# Patient Record
Sex: Male | Born: 1990 | Race: Black or African American | Hispanic: No | Marital: Single | Smoking: Current every day smoker
Health system: Southern US, Community
[De-identification: ages and names within clinical notes are randomized; demographics above are authoritative.]

## PROBLEM LIST (undated history)

## (undated) DIAGNOSIS — S62009A Unspecified fracture of navicular [scaphoid] bone of unspecified wrist, initial encounter for closed fracture: Secondary | ICD-10-CM

## (undated) DIAGNOSIS — R569 Unspecified convulsions: Secondary | ICD-10-CM

## (undated) DIAGNOSIS — Z87828 Personal history of other (healed) physical injury and trauma: Secondary | ICD-10-CM

## (undated) HISTORY — PX: NO PAST SURGERIES: SHX2092

---
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
## 2009-04-10 ENCOUNTER — Emergency Department (HOSPITAL_COMMUNITY): Admission: EM | Admit: 2009-04-10 | Discharge: 2009-04-10 | Payer: Self-pay | Admitting: Emergency Medicine

## 2010-05-25 ENCOUNTER — Emergency Department (HOSPITAL_COMMUNITY)
Admission: EM | Admit: 2010-05-25 | Discharge: 2010-05-25 | Disposition: A | Discharge status: Home / Self Care | Payer: Self-pay | Attending: Emergency Medicine | Admitting: Emergency Medicine

## 2010-05-25 DIAGNOSIS — R404 Transient alteration of awareness: Secondary | ICD-10-CM | POA: Insufficient documentation

## 2010-05-25 DIAGNOSIS — F1210 Cannabis abuse, uncomplicated: Secondary | ICD-10-CM | POA: Insufficient documentation

## 2010-05-25 DIAGNOSIS — G40909 Epilepsy, unspecified, not intractable, without status epilepticus: Secondary | ICD-10-CM | POA: Insufficient documentation

## 2010-05-25 LAB — URINALYSIS, ROUTINE W REFLEX MICROSCOPIC
Bilirubin Urine: NEGATIVE
Glucose, UA: NEGATIVE mg/dL
Hgb urine dipstick: NEGATIVE
Ketones, ur: NEGATIVE mg/dL
Leukocytes, UA: NEGATIVE
Nitrite: NEGATIVE
Protein, ur: 30 mg/dL — AB
Specific Gravity, Urine: 1.024 (ref 1.005–1.030)
Urobilinogen, UA: 0.2 mg/dL (ref 0.0–1.0)
pH: 6 (ref 5.0–8.0)

## 2010-05-25 LAB — BASIC METABOLIC PANEL
Calcium: 9.2 mg/dL (ref 8.4–10.5)
GFR calc Af Amer: >60 mL/min (ref >60)
GFR calc non Af Amer: >60 mL/min (ref >60)
Glucose, Bld: 118 mg/dL — ABNORMAL HIGH (ref 70–99)
Potassium: 3.8 mEq/L (ref 3.5–5.1)
Sodium: 137 mEq/L (ref 135–145)

## 2010-05-25 LAB — DIFFERENTIAL
Basophils Absolute: 0 10*3/uL (ref 0.0–0.1)
Basophils Relative: 1 % (ref 0–1)
Eosinophils Absolute: 0.2 10*3/uL (ref 0.0–0.7)
Eosinophils Relative: 2 % (ref 0–5)
Lymphocytes Relative: 39 % (ref 12–46)
Lymphs Abs: 2.9 K/uL (ref 0.7–4.0)
Monocytes Absolute: 0.7 10*3/uL (ref 0.1–1.0)
Monocytes Relative: 9 % (ref 3–12)
Neutro Abs: 3.6 K/uL (ref 1.7–7.7)
Neutrophils Relative %: 49 % (ref 43–77)

## 2010-05-25 LAB — RAPID URINE DRUG SCREEN, HOSP PERFORMED
Amphetamines: NOT DETECTED
Barbiturates: NOT DETECTED
Benzodiazepines: POSITIVE — AB
Cocaine: NOT DETECTED
Opiates: NOT DETECTED
Tetrahydrocannabinol: POSITIVE — AB

## 2010-05-25 LAB — BASIC METABOLIC PANEL WITH GFR
BUN: 14 mg/dL (ref 6–23)
CO2: 28 meq/L (ref 19–32)
Chloride: 102 meq/L (ref 96–112)
Creatinine, Ser: 1.25 mg/dL (ref 0.4–1.5)

## 2010-05-25 LAB — CBC
HCT: 41.1 % (ref 39.0–52.0)
Hemoglobin: 14 g/dL (ref 13.0–17.0)
MCH: 30.2 pg (ref 26.0–34.0)
MCHC: 34.1 g/dL (ref 30.0–36.0)
MCV: 88.6 fL (ref 78.0–100.0)
Platelets: 183 10*3/uL (ref 150–400)
RBC: 4.64 MIL/uL (ref 4.22–5.81)
RDW: 12.6 % (ref 11.5–15.5)
WBC: 7.4 10*3/uL (ref 4.0–10.5)

## 2010-05-25 LAB — URINE MICROSCOPIC-ADD ON: Urine-Other: NONE SEEN

## 2010-06-27 NOTE — Procedures (Signed)
EEG NUMBER:  [id number].   HISTORY:  A 20 year old male with a history of possible seizure activity  over the last 1-1/2 years.  He becomes dizzy and says strange things  prior to the episodes.  He had a febrile seizure at age 10.  His next  seizure-like event was at age 66.  Study is being done to look for the  presence of seizures (780.39).   PROCEDURE:  The tracing is carried out on a 32-digital Cadwell recorder  reformatted into 16-channel montages with 1 devoted to EKG.  The patient  was awake and asleep during the recording.  The International 10/20  system lead placement was used.   MEDICATIONS:  He takes no medication.   SIGNIFICANT FINDINGS:  Dominant frequency is 9-10 Hz 10 microvolt alpha  range activity.  At times, it is of higher voltage and attenuates with  eye opening.   The patient becomes drowsy with mixed frequency theta and delta range  activity.  The patient then drifts in natural sleep with vertex sharp  waves, symmetric sleep spindles and desynchronized delta range  background.   Activating procedures with photic stimulation induced a driving response  of 0454 Hz.  Hyperventilation caused little change.   EKG showed regular sinus rhythm with ventricular response of 60 beats  per minute.   IMPRESSION:  Normal record with the patient awake and asleep.      Deanna Artis. Sharene Skeans, M.D.  Electronically Signed     [id number]  D:  03/06/2007 20:55:11  T:  03/07/2007 09:47:57  Job #:  [id number]   cc:   Lear Ng, MD  Fax: [phone]

## 2010-11-06 LAB — I-STAT 8, (EC8 V) (CONVERTED LAB)
Acid-base deficit: 3 — ABNORMAL HIGH
Bicarbonate: 22.7
Chloride: 105
HCT: 45
Hemoglobin: 15.3
Operator id: 294341
Sodium: 138
pCO2, Ven: 42.2 — ABNORMAL LOW

## 2010-11-06 LAB — PHENYTOIN LEVEL, TOTAL: Phenytoin Lvl: <2.5 — ABNORMAL LOW

## 2010-11-06 LAB — RAPID URINE DRUG SCREEN, HOSP PERFORMED
Amphetamines: NOT DETECTED
Barbiturates: NOT DETECTED

## 2010-11-06 LAB — POCT I-STAT CREATININE: Operator id: 294341

## 2010-11-27 LAB — RAPID URINE DRUG SCREEN, HOSP PERFORMED
Barbiturates: NOT DETECTED
Opiates: NOT DETECTED

## 2010-11-27 LAB — COMPREHENSIVE METABOLIC PANEL
Alkaline Phosphatase: 109
BUN: 8
Chloride: 104
Glucose, Bld: 96
Potassium: 3.8
Total Bilirubin: 0.4

## 2010-11-27 LAB — URINALYSIS, ROUTINE W REFLEX MICROSCOPIC
Bilirubin Urine: NEGATIVE
Glucose, UA: NEGATIVE
Hgb urine dipstick: NEGATIVE
Nitrite: NEGATIVE
Specific Gravity, Urine: 1.02
pH: 6.5

## 2011-12-25 ENCOUNTER — Encounter (HOSPITAL_COMMUNITY): Payer: Self-pay | Admitting: Emergency Medicine

## 2011-12-25 ENCOUNTER — Emergency Department (HOSPITAL_COMMUNITY)
Admission: EM | Admit: 2011-12-25 | Discharge: 2011-12-25 | Discharge status: Against Medical Advice | Payer: Self-pay | Attending: Emergency Medicine | Admitting: Emergency Medicine

## 2011-12-25 DIAGNOSIS — F17200 Nicotine dependence, unspecified, uncomplicated: Secondary | ICD-10-CM | POA: Insufficient documentation

## 2011-12-25 DIAGNOSIS — G40909 Epilepsy, unspecified, not intractable, without status epilepticus: Secondary | ICD-10-CM | POA: Insufficient documentation

## 2011-12-25 DIAGNOSIS — Z5329 Procedure and treatment not carried out because of patient's decision for other reasons: Secondary | ICD-10-CM

## 2011-12-25 DIAGNOSIS — R569 Unspecified convulsions: Secondary | ICD-10-CM

## 2011-12-25 HISTORY — DX: Unspecified convulsions: R56.9

## 2011-12-25 NOTE — ED Notes (Signed)
[id number]<AV> Expected date:<BR> Expected time: 4:02 PM<BR> Means of arrival:Ambulance<BR> Comments:<BR> **RM 10** Seizure
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
## 2012-09-18 ENCOUNTER — Other Ambulatory Visit: Payer: Self-pay | Admitting: Family

## 2012-09-18 DIAGNOSIS — G40209 Localization-related (focal) (partial) symptomatic epilepsy and epileptic syndromes with complex partial seizures, not intractable, without status epilepticus: Secondary | ICD-10-CM | POA: Insufficient documentation

## 2012-09-18 MED ORDER — KEPPRA XR 750 MG PO TB24: ORAL_TABLET | ORAL | Status: DC

## 2012-09-26 ENCOUNTER — Telehealth: Payer: Self-pay | Admitting: Family

## 2012-09-26 NOTE — Telephone Encounter (Signed)
I called Reginald Dyer and told him that we had received his Keppra XR from patient assistance program and that it was at front desk to pick up. TG

## 2012-11-29 DIAGNOSIS — S62009A Unspecified fracture of navicular [scaphoid] bone of unspecified wrist, initial encounter for closed fracture: Secondary | ICD-10-CM

## 2012-11-29 DIAGNOSIS — Z87828 Personal history of other (healed) physical injury and trauma: Secondary | ICD-10-CM

## 2012-11-29 HISTORY — DX: Unspecified fracture of navicular (scaphoid) bone of unspecified wrist, initial encounter for closed fracture: S62.009A

## 2012-11-29 HISTORY — DX: Personal history of other (healed) physical injury and trauma: Z87.828

## 2012-12-11 ENCOUNTER — Encounter (HOSPITAL_BASED_OUTPATIENT_CLINIC_OR_DEPARTMENT_OTHER): Payer: Self-pay | Admitting: *Deleted

## 2012-12-11 ENCOUNTER — Other Ambulatory Visit: Payer: Self-pay | Admitting: Orthopedic Surgery
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
## 2012-12-11 NOTE — Pre-Procedure Instructions (Signed)
CT chest from 11/29/2012 reviewed with Dr. Sampson Goon; do not need to do pre-op CXR; pt. OK to come for surgery.

## 2012-12-11 NOTE — Pre-Procedure Instructions (Signed)
History discussed with Dr. Sampson Goon; need to find out if pt. had been off his Keppra when he had seizure 11/29/2012.  Needs to be taking med. regularly.  H & P, discharge summary, and CXR requested from [facility].
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
## 2012-12-17 ENCOUNTER — Ambulatory Visit (HOSPITAL_BASED_OUTPATIENT_CLINIC_OR_DEPARTMENT_OTHER): Payer: 59 | Admitting: *Deleted

## 2012-12-17 ENCOUNTER — Encounter (HOSPITAL_BASED_OUTPATIENT_CLINIC_OR_DEPARTMENT_OTHER)
Admission: RE | Disposition: A | Discharge status: Home / Self Care | Payer: Self-pay | Source: Ambulatory Visit | Attending: Orthopedic Surgery

## 2012-12-17 ENCOUNTER — Encounter (HOSPITAL_BASED_OUTPATIENT_CLINIC_OR_DEPARTMENT_OTHER): Payer: Self-pay | Admitting: *Deleted

## 2012-12-17 ENCOUNTER — Ambulatory Visit (HOSPITAL_BASED_OUTPATIENT_CLINIC_OR_DEPARTMENT_OTHER)
Admission: RE | Admit: 2012-12-17 | Discharge: 2012-12-17 | Disposition: A | Discharge status: Home / Self Care | Payer: 59 | Source: Ambulatory Visit | Attending: Orthopedic Surgery | Admitting: Orthopedic Surgery

## 2012-12-17 ENCOUNTER — Encounter (HOSPITAL_BASED_OUTPATIENT_CLINIC_OR_DEPARTMENT_OTHER): Payer: 59 | Admitting: *Deleted

## 2012-12-17 DIAGNOSIS — S62001A Unspecified fracture of navicular [scaphoid] bone of right wrist, initial encounter for closed fracture: Secondary | ICD-10-CM

## 2012-12-17 DIAGNOSIS — S62009A Unspecified fracture of navicular [scaphoid] bone of unspecified wrist, initial encounter for closed fracture: Secondary | ICD-10-CM | POA: Insufficient documentation

## 2012-12-17 HISTORY — PX: OPEN REDUCTION INTERNAL FIXATION (ORIF) SCAPHOID WITH DISTAL RADIUS GRAFT: SHX5667

## 2012-12-17 HISTORY — DX: Unspecified fracture of navicular (scaphoid) bone of unspecified wrist, initial encounter for closed fracture: S62.009A

## 2012-12-17 HISTORY — DX: Personal history of other (healed) physical injury and trauma: Z87.828

## 2012-12-17 LAB — POCT HEMOGLOBIN-HEMACUE: Hemoglobin: 12.9 g/dL — ABNORMAL LOW (ref 13.0–17.0)

## 2012-12-17 SURGERY — OPEN REDUCTION INTERNAL FIXATION (ORIF) SCAPHOID WITH DISTAL RADIUS GRAFT
Anesthesia: Regional | Site: Wrist | Laterality: Right | Wound class: Clean

## 2012-12-17 MED ORDER — LIDOCAINE HCL (CARDIAC) 20 MG/ML IV SOLN
INTRAVENOUS | Status: DC | PRN
  Administered 2012-12-17: 80 mg via INTRAVENOUS

## 2012-12-17 MED ORDER — MIDAZOLAM HCL 2 MG/2ML IJ SOLN
INTRAMUSCULAR | Status: AC
  Filled 2012-12-17: qty 2

## 2012-12-17 MED ORDER — LACTATED RINGERS IV SOLN
INTRAVENOUS | Status: DC
  Administered 2012-12-17 (×2): via INTRAVENOUS

## 2012-12-17 MED ORDER — FENTANYL CITRATE 0.05 MG/ML IJ SOLN
INTRAMUSCULAR | Status: AC
  Filled 2012-12-17: qty 6

## 2012-12-17 MED ORDER — FENTANYL CITRATE 0.05 MG/ML IJ SOLN
50.0000 ug | INTRAMUSCULAR | Status: DC | PRN
  Administered 2012-12-17: 100 ug via INTRAVENOUS

## 2012-12-17 MED ORDER — OXYCODONE HCL 5 MG/5ML PO SOLN: 5.0000 mg | Freq: Once | ORAL | Status: DC | PRN

## 2012-12-17 MED ORDER — OXYCODONE-ACETAMINOPHEN 5-325 MG PO TABS: 1.0000 | ORAL_TABLET | ORAL | Status: DC | PRN

## 2012-12-17 MED ORDER — ONDANSETRON HCL 4 MG/2ML IJ SOLN: 4.0000 mg | Freq: Four times a day (QID) | INTRAMUSCULAR | Status: DC | PRN

## 2012-12-17 MED ORDER — CEFAZOLIN SODIUM-DEXTROSE 2-3 GM-% IV SOLR
2.0000 g | INTRAVENOUS | Status: AC
  Administered 2012-12-17: 2 g via INTRAVENOUS

## 2012-12-17 MED ORDER — CEFAZOLIN SODIUM-DEXTROSE 2-3 GM-% IV SOLR
INTRAVENOUS | Status: AC
  Filled 2012-12-17: qty 50

## 2012-12-17 MED ORDER — HYDROMORPHONE HCL PF 1 MG/ML IJ SOLN: 0.2500 mg | INTRAMUSCULAR | Status: DC | PRN

## 2012-12-17 MED ORDER — FENTANYL CITRATE 0.05 MG/ML IJ SOLN
INTRAMUSCULAR | Status: AC
  Filled 2012-12-17: qty 2

## 2012-12-17 MED ORDER — DEXAMETHASONE SODIUM PHOSPHATE 10 MG/ML IJ SOLN
INTRAMUSCULAR | Status: DC | PRN
  Administered 2012-12-17: 10 mg via INTRAVENOUS

## 2012-12-17 MED ORDER — BUPIVACAINE HCL (PF) 0.25 % IJ SOLN
INTRAMUSCULAR | Status: AC
  Filled 2012-12-17: qty 30

## 2012-12-17 MED ORDER — BUPIVACAINE HCL (PF) 0.25 % IJ SOLN
INTRAMUSCULAR | Status: DC | PRN
  Administered 2012-12-17: 5 mL

## 2012-12-17 MED ORDER — PROPOFOL 10 MG/ML IV BOLUS
INTRAVENOUS | Status: DC | PRN
  Administered 2012-12-17: 260 mg via INTRAVENOUS

## 2012-12-17 MED ORDER — OXYCODONE HCL 5 MG PO TABS: 5.0000 mg | ORAL_TABLET | Freq: Once | ORAL | Status: DC | PRN

## 2012-12-17 MED ORDER — MIDAZOLAM HCL 2 MG/2ML IJ SOLN
1.0000 mg | INTRAMUSCULAR | Status: DC | PRN
  Administered 2012-12-17: 2 mg via INTRAVENOUS

## 2012-12-17 MED ORDER — ONDANSETRON HCL 4 MG/2ML IJ SOLN
INTRAMUSCULAR | Status: DC | PRN
  Administered 2012-12-17: 4 mg via INTRAVENOUS

## 2012-12-17 MED ORDER — CHLORHEXIDINE GLUCONATE 4 % EX LIQD: 60.0000 mL | Freq: Once | CUTANEOUS | Status: DC

## 2012-12-17 MED ORDER — BUPIVACAINE-EPINEPHRINE PF 0.5-1:200000 % IJ SOLN
INTRAMUSCULAR | Status: DC | PRN
  Administered 2012-12-17: 30 mL

## 2012-12-17 SURGICAL SUPPLY — 67 items
.045 K-WIRE ×3 IMPLANT
2.2 DRILL BIT ×1 IMPLANT
APL SKNCLS STERI-STRIP NONHPOA (GAUZE/BANDAGES/DRESSINGS)
BAG DECANTER FOR FLEXI CONT (MISCELLANEOUS) IMPLANT
BANDAGE ELASTIC 3 VELCRO ST LF (GAUZE/BANDAGES/DRESSINGS) IMPLANT
BANDAGE ELASTIC 4 VELCRO ST LF (GAUZE/BANDAGES/DRESSINGS) ×2 IMPLANT
BANDAGE GAUZE ELAST BULKY 4 IN (GAUZE/BANDAGES/DRESSINGS) ×2 IMPLANT
BENZOIN TINCTURE PRP APPL 2/3 (GAUZE/BANDAGES/DRESSINGS) IMPLANT
BLADE MINI RND TIP GREEN BEAV (BLADE) IMPLANT
BLADE SURG 15 STRL LF DISP TIS (BLADE) ×2 IMPLANT
BLADE SURG 15 STRL SS (BLADE) ×4
BNDG CMPR 9X4 STRL LF SNTH (GAUZE/BANDAGES/DRESSINGS) ×1
BNDG ESMARK 4X9 LF (GAUZE/BANDAGES/DRESSINGS) ×1 IMPLANT
CANISTER SUCT 1200ML W/VALVE (MISCELLANEOUS) IMPLANT
CORDS BIPOLAR (ELECTRODE) ×2 IMPLANT
COVER TABLE BACK 60X90 (DRAPES) ×2 IMPLANT
CUFF TOURNIQUET SINGLE 18IN (TOURNIQUET CUFF) ×1 IMPLANT
DECANTER SPIKE VIAL GLASS SM (MISCELLANEOUS) IMPLANT
DRAPE EXTREMITY T 121X128X90 (DRAPE) ×2 IMPLANT
DRAPE OEC MINIVIEW 54X84 (DRAPES) ×2 IMPLANT
DRAPE SURG 17X23 STRL (DRAPES) ×2 IMPLANT
DURAPREP 26ML APPLICATOR (WOUND CARE) ×2 IMPLANT
GAUZE SPONGE 4X4 16PLY XRAY LF (GAUZE/BANDAGES/DRESSINGS) IMPLANT
GAUZE XEROFORM 1X8 LF (GAUZE/BANDAGES/DRESSINGS) ×1 IMPLANT
GLOVE BIO SURGEON STRL SZ 6.5 (GLOVE) ×1 IMPLANT
GLOVE BIO SURGEON STRL SZ8 (GLOVE) ×2 IMPLANT
GLOVE BIOGEL PI IND STRL 7.0 (GLOVE) IMPLANT
GLOVE BIOGEL PI IND STRL 8 (GLOVE) IMPLANT
GLOVE BIOGEL PI IND STRL 8.5 (GLOVE) IMPLANT
GLOVE BIOGEL PI INDICATOR 7.0 (GLOVE) ×1
GLOVE BIOGEL PI INDICATOR 8 (GLOVE) ×1
GLOVE BIOGEL PI INDICATOR 8.5 (GLOVE) ×1
GLOVE ECLIPSE 6.5 STRL STRAW (GLOVE) ×1 IMPLANT
GLOVE SURG ORTHO 8.0 STRL STRW (GLOVE) ×1 IMPLANT
GOWN BRE IMP PREV XXLGXLNG (GOWN DISPOSABLE) ×3 IMPLANT
GOWN PREVENTION PLUS XLARGE (GOWN DISPOSABLE) ×3 IMPLANT
NDL HYPO 25X1 1.5 SAFETY (NEEDLE) ×1 IMPLANT
NEEDLE HYPO 25X1 1.5 SAFETY (NEEDLE) ×2 IMPLANT
NS IRRIG 1000ML POUR BTL (IV SOLUTION) ×2 IMPLANT
PACK BASIN DAY SURGERY FS (CUSTOM PROCEDURE TRAY) ×2 IMPLANT
PAD CAST 3X4 CTTN HI CHSV (CAST SUPPLIES) ×1 IMPLANT
PAD CAST 4YDX4 CTTN HI CHSV (CAST SUPPLIES) IMPLANT
PADDING CAST ABS 4INX4YD NS (CAST SUPPLIES) ×1
PADDING CAST ABS COTTON 4X4 ST (CAST SUPPLIES) ×1 IMPLANT
PADDING CAST COTTON 3X4 STRL (CAST SUPPLIES) ×2
PADDING CAST COTTON 4X4 STRL (CAST SUPPLIES) ×2
SCREW COMPRESSION 3.5X20 (Screw) ×1 IMPLANT
SHEET MEDIUM DRAPE 40X70 STRL (DRAPES) ×2 IMPLANT
SPLINT PLASTER CAST XFAST 3X15 (CAST SUPPLIES) IMPLANT
SPLINT PLASTER CAST XFAST 4X15 (CAST SUPPLIES) ×5 IMPLANT
SPLINT PLASTER XTRA FAST SET 4 (CAST SUPPLIES) ×15
SPLINT PLASTER XTRA FASTSET 3X (CAST SUPPLIES)
SPONGE GAUZE 4X4 12PLY (GAUZE/BANDAGES/DRESSINGS) ×2 IMPLANT
STOCKINETTE 4X48 STRL (DRAPES) ×2 IMPLANT
STRIP CLOSURE SKIN 1/2X4 (GAUZE/BANDAGES/DRESSINGS) IMPLANT
SUCTION FRAZIER TIP 10 FR DISP (SUCTIONS) IMPLANT
SUT ETHILON 4 0 PS 2 18 (SUTURE) IMPLANT
SUT MERSILENE 4 0 P 3 (SUTURE) IMPLANT
SUT PROLENE 3 0 PS 2 (SUTURE) IMPLANT
SUT SILK 2 0 FS (SUTURE) IMPLANT
SUT VIC AB 3-0 FS2 27 (SUTURE) IMPLANT
SUT VICRYL RAPIDE 4/0 PS 2 (SUTURE) IMPLANT
SYR BULB 3OZ (MISCELLANEOUS) ×2 IMPLANT
SYRINGE 10CC LL (SYRINGE) ×2 IMPLANT
TOWEL OR 17X24 6PK STRL BLUE (TOWEL DISPOSABLE) ×2 IMPLANT
TUBE CONNECTING 20X1/4 (TUBING) IMPLANT
UNDERPAD 30X30 INCONTINENT (UNDERPADS AND DIAPERS) ×2 IMPLANT

## 2012-12-17 NOTE — Op Note (Signed)
See [id number]

## 2012-12-17 NOTE — Interval H&P Note (Signed)
History and Physical Interval Note:  12/17/2012 8:38 AM  Reginald Dyer  has presented today for surgery, with the diagnosis of right scaphoid fracture   The various methods of treatment have been discussed with the patient and family. After consideration of risks, benefits and other options for treatment, the patient has consented to  Procedure(s): OPEN REDUCTION INTERNAL FIXATION (ORIF) RIGHT SCAPHOID WITH POSSIBLE DISTAL RADIUS GRAFT (Right) as a surgical intervention .  The patient's history has been reviewed, patient examined, no change in status, stable for surgery.  I have reviewed the patient's chart and labs.  Questions were answered to the patient's satisfaction.     [name]

## 2012-12-17 NOTE — Anesthesia Procedure Notes (Addendum)
Procedure Name: LMA Insertion Date/Time: 12/17/2012 2:40 PM Performed by: [name] Pre-anesthesia Checklist: Patient identified, Emergency Drugs available, Suction available and Patient being monitored Patient Re-evaluated:Patient Re-evaluated prior to inductionOxygen Delivery Method: Circle System Utilized Preoxygenation: Pre-oxygenation with 100% oxygen Intubation Type: IV induction Ventilation: Mask ventilation without difficulty LMA: LMA inserted LMA Size: 5.0 Number of attempts: 1 Airway Equipment and Method: bite block Placement Confirmation: positive ETCO2 and breath sounds checked- equal and bilateral Tube secured with: Tape Dental Injury: Teeth and Oropharynx as per pre-operative assessment    Anesthesia Regional Block:  Infraclavicular brachial plexus block  Pre-Anesthetic Checklist: ,, timeout performed, Correct Patient, Correct Site, Correct Laterality, Correct Procedure, Correct Position, site marked, Risks and benefits discussed,  Surgical consent,  Pre-op evaluation,  At surgeon's request and post-op pain management  Laterality: Right  Prep: chloraprep       Needles:  Injection technique: Single-shot  Needle Type: Echogenic Stimulator Needle     Needle Length:cm 9 cm Needle Gauge: 21 and 21 G    Additional Needles:  Procedures: ultrasound guided (picture in chart) and nerve stimulator Infraclavicular brachial plexus block  Nerve Stimulator or Paresthesia:  Response: biceps flexion, 0.45 mA,   Additional Responses:   Narrative:  Start time: 12/17/2012 2:01 PM End time: 12/17/2012 2:14 PM Injection made incrementally with aspirations every 5 mL.  Performed by: Personally  Anesthesiologist: Dr Chaney Malling  Additional Notes: Functioning IV was confirmed and monitors were applied.  A 90mm 21ga Arrow echogenic stimulator needle was used. Sterile prep and drape,hand hygiene and sterile gloves were used.  Negative aspiration and negative test dose  prior to incremental administration of local anesthetic. The patient tolerated the procedure well.  Ultrasound guidance: relevent anatomy identified, needle position confirmed, local anesthetic spread visualized around nerve(s), vascular puncture avoided.  Image printed for medical record.   Infraclavicular brachial plexus block

## 2012-12-17 NOTE — Anesthesia Postprocedure Evaluation (Signed)
Anesthesia Post Note  Patient: Reginald Dyer  Procedure(s) Performed: Procedure(s) (LRB): OPEN REDUCTION INTERNAL FIXATION (ORIF) RIGHT SCAPHOID WITH POSSIBLE DISTAL RADIUS GRAFT (Right)  Anesthesia type: General  Patient location: PACU  Post pain: Pain level controlled and Adequate analgesia  Post assessment: Post-op Vital signs reviewed, Patient's Cardiovascular Status Stable, Respiratory Function Stable, Patent Airway and Pain level controlled  Last Vitals:  Filed Vitals:   12/17/12 1615  BP: 119/79  Pulse: 49  Temp:   Resp: 14    Post vital signs: Reviewed and stable  Level of consciousness: awake, alert  and oriented  Complications: No apparent anesthesia complications
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
## 2012-12-17 NOTE — Transfer of Care (Signed)
Immediate Anesthesia Transfer of Care Note  Patient: Reginald Dyer  Procedure(s) Performed: Procedure(s): OPEN REDUCTION INTERNAL FIXATION (ORIF) RIGHT SCAPHOID WITH POSSIBLE DISTAL RADIUS GRAFT (Right)  Patient Location: PACU  Anesthesia Type:GA combined with regional for post-op pain  Level of Consciousness: awake, alert  and oriented  Airway & Oxygen Therapy: Patient Spontanous Breathing and Patient connected to face mask oxygen  Post-op Assessment: Report given to PACU RN, Post -op Vital signs reviewed and stable and Patient moving all extremities  Post vital signs: Reviewed and stable  Complications: No apparent anesthesia complications

## 2012-12-17 NOTE — Progress Notes (Signed)
Assisted Dr. Hodierne with right, ultrasound guided, infraclavicular block. Side rails up, monitors on throughout procedure. See vital signs in flow sheet. Tolerated Procedure well. 

## 2012-12-17 NOTE — Anesthesia Preprocedure Evaluation (Signed)
Anesthesia Evaluation  Patient identified by MRN, date of birth, ID band Patient awake    Reviewed: Allergy & Precautions, H&P , NPO status , Patient's Chart, lab work & pertinent test results  Airway Mallampati: II  Neck ROM: full    Dental   Pulmonary former smoker,          Cardiovascular negative cardio ROS      Neuro/Psych Seizures -,     GI/Hepatic   Endo/Other    Renal/GU      Musculoskeletal   Abdominal   Peds  Hematology   Anesthesia Other Findings   Reproductive/Obstetrics                           Anesthesia Physical Anesthesia Plan  ASA: II  Anesthesia Plan: General and Regional   Post-op Pain Management: MAC Combined w/ Regional for Post-op pain   Induction: Intravenous  Airway Management Planned: LMA  Additional Equipment:   Intra-op Plan:   Post-operative Plan:   Informed Consent: I have reviewed the patients History and Physical, chart, labs and discussed the procedure including the risks, benefits and alternatives for the proposed anesthesia with the patient or authorized representative who has indicated his/her understanding and acceptance.     Plan Discussed with: CRNA, Anesthesiologist and Surgeon  Anesthesia Plan Comments:         Anesthesia Quick Evaluation

## 2012-12-19 ENCOUNTER — Encounter (HOSPITAL_BASED_OUTPATIENT_CLINIC_OR_DEPARTMENT_OTHER): Payer: Self-pay | Admitting: Orthopedic Surgery

## 2012-12-19 NOTE — Op Note (Signed)
NAME:  Reginald Dyer, Reginald Dyer                     ACCOUNT NO.:  MEDICAL RECORD NO.:  [id number]  LOCATION:                               FACILITY:  [facility]  PHYSICIAN:  Artist Pais. Adrieanna Boteler, M.D.DATE OF BIRTH:  Feb 10, 1991  DATE OF PROCEDURE:  12/17/2012 DATE OF DISCHARGE:  12/17/2012                              OPERATIVE REPORT   PREOPERATIVE DIAGNOSIS:  Right scaphoid fracture.  POSTOPERATIVE DIAGNOSIS:  Right scaphoid fracture.  PROCEDURE:  Open reduction and internal fixation of above.  SURGEON:  Artist Pais. Mina Marble, MD  ASSISTANT:  Cindee Salt, MD  ANESTHESIA:  General.  COMPLICATIONS:  No complication.  DRAINS:  No drains.  DESCRIPTION OF PROCEDURE:  The patient was taken to the operating suite. After induction of general anesthesia, right upper extremity prepped and draped in sterile fashion.  An Esmarch was used to exsanguinate the limb.  Tourniquet was then inflated to 250 mmHg.  At this point in time, a mini Arthrex guidewire for compression screw set was driven through a small incision at the distal pole of the scaphoid trapezial area and the guide wire was placed onto the bone under fluoroscopic guidance.  It was driven across the fracture site.  AP, lateral, and oblique views were used to determine good pin position.  The 2nd pin was used to prevent rotational issues. Once this was done, we used the appropriate Arthrex compression drills to drill the distal pole and then across the fracture site into the proximal pole.  A 20 mm screw was placed under direct and fluoroscopic guidance.  Intraoperative fluoroscopy revealed adequate reduction of the AP, lateral, and oblique view.  The guidewires were removed.  The wound was irrigated and closed with 4-0 Vicryl Rapide. Xeroform, 4x4s, and radial gutter splint was applied.  The patient tolerated the procedure well, went to recovery in stable fashion.     Artist Pais Mina Marble, M.D.     MAW/MEDQ  D:  12/17/2012  T:   12/18/2012  Job:  [id number]

## 2013-03-20 ENCOUNTER — Telehealth: Payer: Self-pay

## 2013-03-20 DIAGNOSIS — G40209 Localization-related (focal) (partial) symptomatic epilepsy and epileptic syndromes with complex partial seizures, not intractable, without status epilepticus: Secondary | ICD-10-CM

## 2013-03-20 NOTE — Telephone Encounter (Signed)
No, I am sorry but Reginald Dyer needs to come in to be seen before I can authorize this Rx for 6 months. He was last seen in 2013, as far as I can see. I will be happy to work him in next week, even if I don't have an open appointment slot. Just let me know and I will work with him about an appointment time. Thanks, Inetta Fermoina

## 2013-03-20 NOTE — Telephone Encounter (Signed)
I scheduled him for Monday at 2pm. I will give him the prescription then. TG

## 2013-03-20 NOTE — Telephone Encounter (Signed)
I lvm for pt to call me so that I can schedule appt.

## 2013-03-20 NOTE — Telephone Encounter (Signed)
Reginald Dyer stating that he needed refill on Keppra XR 750 mg. I called him back. He will pick the Rx up when ready. Needs to be written for 6 mo. Supply bc it he gets it through [facility]. I will call Robbi GarterWilbert when ready [phone].

## 2013-03-20 NOTE — Telephone Encounter (Signed)
I spoke w Robbi GarterWilbert. He said that the only day/time he can come in is at 2 pm on Monday 03/23/13. Inetta Fermoina, can you please put this in? Thanks, McK[employer]ammy

## 2013-03-23 ENCOUNTER — Ambulatory Visit (INDEPENDENT_AMBULATORY_CARE_PROVIDER_SITE_OTHER): Payer: BC Managed Care – PPO | Admitting: Family

## 2013-03-23 ENCOUNTER — Encounter: Payer: Self-pay | Admitting: Family

## 2013-03-23 VITALS — BP 120/70 | HR 80 | Ht 71.0 in | Wt 172.2 lb

## 2013-03-23 DIAGNOSIS — G40209 Localization-related (focal) (partial) symptomatic epilepsy and epileptic syndromes with complex partial seizures, not intractable, without status epilepticus: Secondary | ICD-10-CM

## 2013-03-23 MED ORDER — CLONAZEPAM 0.5 MG PO TABS: ORAL_TABLET | ORAL | Status: DC

## 2013-03-23 MED ORDER — LEVETIRACETAM 1000 MG PO TABS: ORAL_TABLET | ORAL | Status: DC
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
## 2013-03-23 NOTE — Patient Instructions (Addendum)
I have changed your Keppra to generic Levetiracetam to help with cost. I have also increased the dose.  The new instructions are Levetiracetam 1000mg  - take 2 tablets in the morning and 2 tablets in the evening.  Call me if you have any seizures.  I have given you a prescription for Clonazepam 0.5mg  - take 1/2 tablet if you have a seizure aura (or warning feeling). Let me know if this works to stop a seizure.  Do not drink alcohol when taking the Clonazepam.  Remember that alcohol will interact with your Levetiracetam and make it less effective for preventing seizures. I will complete your DMV form. Do not drive until released to do so by the [facility]. Please plan to return for follow up in 6 months or sooner if needed.

## 2013-03-25 ENCOUNTER — Encounter: Payer: Self-pay | Admitting: Family

## 2013-05-18 ENCOUNTER — Ambulatory Visit: Payer: Self-pay | Admitting: Pediatrics

## 2013-10-02 ENCOUNTER — Other Ambulatory Visit: Payer: Self-pay | Admitting: Family

## 2013-10-02 DIAGNOSIS — G40209 Localization-related (focal) (partial) symptomatic epilepsy and epileptic syndromes with complex partial seizures, not intractable, without status epilepticus: Secondary | ICD-10-CM

## 2013-10-02 MED ORDER — LEVETIRACETAM 1000 MG PO TABS: ORAL_TABLET | ORAL | Status: DC

## 2013-10-02 NOTE — Telephone Encounter (Signed)
Previous Rx did not print 

## 2013-10-02 NOTE — Telephone Encounter (Signed)
Wants to pick up Rx today - because out of medication. He has an appt on Monday. I will give him an Rx today. Called patient to pick up Rx. TG

## 2013-10-05 ENCOUNTER — Encounter: Payer: Self-pay | Admitting: Family

## 2013-10-05 ENCOUNTER — Ambulatory Visit (INDEPENDENT_AMBULATORY_CARE_PROVIDER_SITE_OTHER): Payer: BC Managed Care – PPO | Admitting: Family

## 2013-10-05 VITALS — BP 114/70 | HR 78 | Ht 71.0 in | Wt 167.2 lb

## 2013-10-05 DIAGNOSIS — G40209 Localization-related (focal) (partial) symptomatic epilepsy and epileptic syndromes with complex partial seizures, not intractable, without status epilepticus: Secondary | ICD-10-CM

## 2013-10-05 MED ORDER — LEVETIRACETAM 1000 MG PO TABS: ORAL_TABLET | ORAL | Status: DC
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
## 2013-10-06 ENCOUNTER — Encounter: Payer: Self-pay | Admitting: Family

## 2013-10-06 NOTE — Patient Instructions (Signed)
Continue Levetiracetam  2 tablets in the morning and 2 tablets in the evening. Let me know if you have any seizures.   I will refer you to Dr Karel Jarvis at [facility] for transfer of care of your epilepsy. I will complete DMV forms and take care of your refills until you see Dr Karel Jarvis next year. Call me for any problems or seizures until you time for appointment with her next summer.

## 2013-12-28 ENCOUNTER — Telehealth: Payer: Self-pay | Admitting: Family
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
## 2013-12-29 NOTE — Telephone Encounter (Signed)
Discussed with Dr Sharene SkeansHickling. Letter written, faxed and mailed to the [facility]. TG

## 2014-04-23 ENCOUNTER — Other Ambulatory Visit: Payer: Self-pay | Admitting: Family

## 2014-04-23 DIAGNOSIS — G40209 Localization-related (focal) (partial) symptomatic epilepsy and epileptic syndromes with complex partial seizures, not intractable, without status epilepticus: Secondary | ICD-10-CM

## 2014-04-23 MED ORDER — LEVETIRACETAM 1000 MG PO TABS: ORAL_TABLET | ORAL | Status: DC

## 2014-04-27 ENCOUNTER — Telehealth: Payer: Self-pay

## 2014-04-27 NOTE — Telephone Encounter (Signed)
Fleet ContrasRachel called and lvm requesting refill on pt's levetiracetam. I called her back and let her know that this was sent to the pharmacy on 04-23-14. I confirmed the pharmacy with her and updated her phone number in the contacts. She had no additional questions.

## 2014-08-19 ENCOUNTER — Encounter: Payer: Self-pay | Admitting: Family

## 2014-08-30 ENCOUNTER — Telehealth: Payer: Self-pay | Admitting: *Deleted

## 2014-08-30 DIAGNOSIS — G40209 Localization-related (focal) (partial) symptomatic epilepsy and epileptic syndromes with complex partial seizures, not intractable, without status epilepticus: Secondary | ICD-10-CM

## 2014-08-30 NOTE — Telephone Encounter (Signed)
Reginald Dyer called and left voicemail stating that Dr. Patrcia DollyKaren Aquino will need a new referral to get [facility] due to it being over 3 months. She states that she would like a call when this is done so she can call to schedule. Please advise.

## 2014-08-31 NOTE — Telephone Encounter (Signed)
Fleet ContrasRachel notified and verbalized understanding.

## 2014-08-31 NOTE — Telephone Encounter (Signed)
Please let Fleet ContrasRachel know that I sent a new referral over to Dr Aquino's office. Thanks, Inetta Fermoina

## 2014-09-28 ENCOUNTER — Telehealth: Payer: Self-pay

## 2014-09-28 DIAGNOSIS — G40209 Localization-related (focal) (partial) symptomatic epilepsy and epileptic syndromes with complex partial seizures, not intractable, without status epilepticus: Secondary | ICD-10-CM

## 2014-09-28 MED ORDER — LEVETIRACETAM 1000 MG PO TABS: ORAL_TABLET | ORAL | Status: DC

## 2014-09-28 NOTE — Telephone Encounter (Signed)
Prescription electronically sent

## 2014-09-28 NOTE — Telephone Encounter (Signed)
Reginald Dyer, patient's girlfriend, lvm stating that pt has appt with new neurologist, Van Clines, MD  on 11-01-14. Reginald Dyer needs refill on his levetiracetam 100 mg tabs 2 tabs po bid, sent to pharmcy to last until he is seen by new neuro. Confirmed pharmacy.

## 2014-11-01 ENCOUNTER — Ambulatory Visit: Payer: Self-pay | Admitting: Neurology

## 2014-11-05 ENCOUNTER — Encounter: Payer: Self-pay | Admitting: Neurology

## 2014-11-05 ENCOUNTER — Ambulatory Visit (INDEPENDENT_AMBULATORY_CARE_PROVIDER_SITE_OTHER): Payer: Self-pay | Admitting: Neurology

## 2014-11-05 VITALS — BP 100/70 | HR 78 | Ht 71.0 in | Wt 171.6 lb

## 2014-11-05 DIAGNOSIS — G40009 Localization-related (focal) (partial) idiopathic epilepsy and epileptic syndromes with seizures of localized onset, not intractable, without status epilepticus: Secondary | ICD-10-CM | POA: Insufficient documentation

## 2014-11-05 MED ORDER — LEVETIRACETAM 750 MG PO TABS: ORAL_TABLET | ORAL | Status: DC
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
## 2015-01-03 ENCOUNTER — Telehealth: Payer: Self-pay | Admitting: Neurology

## 2015-01-03 MED ORDER — LEVETIRACETAM 1000 MG PO TABS: ORAL_TABLET | ORAL | Status: DC

## 2015-01-03 NOTE — Telephone Encounter (Signed)
PT's wife Fleet ContrasRachel called in regards to his medication Keppra/Dawn CB# [phone]

## 2015-01-03 NOTE — Telephone Encounter (Signed)
Returned call. Patient's girlfriend/[name] states that when patient was here he did say he was on Keppra 750 mg 2 tablets twice a day, but she states that he has since realized that he had been on Keppra 1000 mg 2 tablets twice a day from his previous neurologist and he needed a new Rx sent to his pharmacy for that dose and sig. I did call the pharmacy to verify that this was the Rx patient had been getting and it was verified that the Keppra 1000 mg was the Rx the patient had been taking.

## 2015-03-11 ENCOUNTER — Telehealth: Payer: Self-pay | Admitting: Family Medicine

## 2015-03-11 NOTE — Telephone Encounter (Signed)
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

## 2015-03-11 NOTE — Telephone Encounter (Signed)
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 Please fax to [facility], Attn: Ander Slade, RN  [phone].

## 2015-03-16 ENCOUNTER — Encounter: Payer: Self-pay | Admitting: Neurology

## 2015-03-16 ENCOUNTER — Telehealth: Payer: Self-pay | Admitting: Neurology

## 2015-03-16 NOTE — Telephone Encounter (Signed)
I returned call. Patient's employer needs for work note to state that his no climbing or working from heights restrictions are permanent. We will revise the letter and re-fax.

## 2015-03-16 NOTE — Telephone Encounter (Signed)
Note has been faxed to employer. Patient was notified of this.

## 2015-03-16 NOTE — Telephone Encounter (Signed)
Marcelle Smiling with [employer] called in regards to PT's work note and would like a call back at [phone]/Dawn

## 2015-03-16 NOTE — Telephone Encounter (Signed)
Note was re-faxed.  ?

## 2015-05-25 ENCOUNTER — Telehealth: Payer: Self-pay | Admitting: Neurology

## 2015-05-25 MED ORDER — LEVETIRACETAM 1000 MG PO TABS: ORAL_TABLET | ORAL | Status: DC

## 2015-05-25 NOTE — Telephone Encounter (Signed)
Refills sent to pharmacy. 

## 2015-05-25 NOTE — Telephone Encounter (Signed)
Pt needs a refill on keppra called in to the [facility] pt number is [phone]

## 2015-05-25 NOTE — Telephone Encounter (Signed)
Pt would need a 6 month supply he has appt in sept to see dr Karel Jarvisaquino

## 2015-06-01 ENCOUNTER — Telehealth: Payer: Self-pay | Admitting: Neurology

## 2015-06-01 NOTE — Telephone Encounter (Signed)
Spoke with patients girlfriend/[name]. She wanted to check on patients Keppra refill. I told her that Rx was sent to his pharmacy on last Wed and should be there on hold for him. Pharmacy did receive the rx refills.

## 2015-06-01 NOTE — Telephone Encounter (Signed)
PT called in regards to his prescription Keppra/Dawn CB#[phone]

## 2015-06-02 ENCOUNTER — Telehealth: Payer: Self-pay | Admitting: Neurology

## 2015-06-02 MED ORDER — LEVETIRACETAM 1000 MG PO TABS: ORAL_TABLET | ORAL | Status: DC

## 2015-06-02 NOTE — Telephone Encounter (Signed)
Reginald HuggerRachel Dyer was calling on behalf of Reginald ColletWilbert Dyer 02/27/1990. They would like to see if his medication Keppra 1000 mg 120 could be called in at [facility] at [address]. They found out it will be a lot cheaper through them. Her contact number is [phone]. Thank you

## 2015-06-02 NOTE — Telephone Encounter (Signed)
Rx sent to new pharmacy. Patients girlfriend was notified of this.

## 2015-06-14 ENCOUNTER — Telehealth: Payer: Self-pay | Admitting: Neurology

## 2015-06-14 ENCOUNTER — Encounter: Payer: Self-pay | Admitting: Neurology

## 2015-06-14 NOTE — Telephone Encounter (Signed)
Pt fiance Fleet ContrasRachel would like to talk to someone about patient having a seizure yesterday and going back to work please call [phone]

## 2015-06-14 NOTE — Telephone Encounter (Signed)
Note faxed to nurses office.  [phone]

## 2015-06-14 NOTE — Telephone Encounter (Signed)
Patient had a seizure yesterday because he forgot his meds.  Can he get a note to return back to work?
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
## 2015-06-14 NOTE — Telephone Encounter (Signed)
Please find out what type of work he does - with his recent seizure, he should not be operating heavy machinery, avoid climbing and working at heights.  Emphasize medication compliance and inform him of  driving laws to stop driving after a seizure, until 6 months seizure-free, which was discussed at his last office visit with Dr. Karel JarvisAquino.    Sanae Willetts K. Allena KatzPatel, DO

## 2015-06-14 NOTE — Telephone Encounter (Signed)
Noted. Letter will be written to return to work as this is a breakthrough seizure in the setting of medication noncompliance.    Reginald K. Allena KatzPatel, DO

## 2015-11-11 ENCOUNTER — Ambulatory Visit (INDEPENDENT_AMBULATORY_CARE_PROVIDER_SITE_OTHER): Payer: BLUE CROSS/BLUE SHIELD | Admitting: Neurology

## 2015-11-11 ENCOUNTER — Encounter: Payer: Self-pay | Admitting: Neurology

## 2015-11-11 VITALS — BP 118/70 | HR 76 | Temp 98.1°F | Ht 71.0 in | Wt 167.4 lb

## 2015-11-11 DIAGNOSIS — G40009 Localization-related (focal) (partial) idiopathic epilepsy and epileptic syndromes with seizures of localized onset, not intractable, without status epilepticus: Secondary | ICD-10-CM

## 2015-11-11 MED ORDER — LEVETIRACETAM 1000 MG PO TABS: ORAL_TABLET | ORAL | 3 refills | Status: DC
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
## 2015-11-11 NOTE — Patient Instructions (Signed)
1. Continue Keppra 1000mg : Take 2 tablets twice a day 2. Follow-up in 1 year, call for any changes  Seizure Precautions: 1. If medication has been prescribed for you to prevent seizures, take it exactly as directed.  Do not stop taking the medicine without talking to your doctor first, even if you have not had a seizure in a long time.   2. Avoid activities in which a seizure would cause danger to yourself or to others.  Don't operate dangerous machinery, swim alone, or climb in high or dangerous places, such as on ladders, roofs, or girders.  Do not drive unless your doctor says you may.  3. If you have any warning that you may have a seizure, lay down in a safe place where you can't hurt yourself.    4.  No driving for 6 months from last seizure, as per [place].   Please refer to the following link on the Epilepsy Foundation of America's website for more information: http://www.epilepsyfoundation.org/answerplace/Social/driving/drivingu.cfm   5.  Maintain good sleep hygiene. Avoid alcohol.  6.  Contact your doctor if you have any problems that may be related to the medicine you are taking.  7.  Call 911 and bring the patient back to the ED if:        A.  The seizure lasts longer than 5 minutes.       B.  The patient doesn't awaken shortly after the seizure  C.  The patient has new problems such as difficulty seeing, speaking or moving  D.  The patient was injured during the seizure  E.  The patient has a temperature over 102 F (39C)  F.  The patient vomited and now is having trouble breathing

## 2015-11-21 ENCOUNTER — Encounter: Payer: Self-pay | Admitting: Neurology

## 2015-11-22 ENCOUNTER — Telehealth: Payer: Self-pay | Admitting: Neurology

## 2015-11-22 NOTE — Telephone Encounter (Signed)
Patient state that [facility] is now using [facility] and we need to fax a rx to them at [phone] also he will need a [facility] to [facility] until it comes in [facility] [place] patient phone [phone] please call if there is a problem

## 2015-11-22 NOTE — Telephone Encounter (Signed)
Notified pt RX sent to pharmacy. °

## 2016-11-09 ENCOUNTER — Ambulatory Visit (INDEPENDENT_AMBULATORY_CARE_PROVIDER_SITE_OTHER): Payer: BLUE CROSS/BLUE SHIELD | Admitting: Neurology

## 2016-11-09 ENCOUNTER — Encounter: Payer: Self-pay | Admitting: Neurology

## 2016-11-09 VITALS — BP 120/78 | HR 65 | Ht 71.0 in | Wt 168.0 lb

## 2016-11-09 DIAGNOSIS — G40009 Localization-related (focal) (partial) idiopathic epilepsy and epileptic syndromes with seizures of localized onset, not intractable, without status epilepticus: Secondary | ICD-10-CM

## 2016-11-09 MED ORDER — LEVETIRACETAM 1000 MG PO TABS: ORAL_TABLET | ORAL | 3 refills | Status: DC

## 2016-11-09 NOTE — Patient Instructions (Signed)
1. Continue Keppra 1000mg twice a day 2. Follow-up in 1 year, call for any changes  Seizure Precautions: 1. If medication has been prescribed for you to prevent seizures, take it exactly as directed.  Do not stop taking the medicine without talking to your doctor first, even if you have not had a seizure in a long time.   2. Avoid activities in which a seizure would cause danger to yourself or to others.  Don't operate dangerous machinery, swim alone, or climb in high or dangerous places, such as on ladders, roofs, or girders.  Do not drive unless your doctor says you may.  3. If you have any warning that you may have a seizure, lay down in a safe place where you can't hurt yourself.    4.  No driving for 6 months from last seizure, as per [place] state law.   Please refer to the following link on the Epilepsy Foundation of [employer]'s website for more information: http://www.epilepsyfoundation.org/answerplace/Social/driving/drivingu.cfm   5.  Maintain good sleep hygiene. Avoid alcohol.  6.  Contact your doctor if you have any problems that may be related to the medicine you are taking.  7.  Call 911 and bring the patient back to the ED if:        A.  The seizure lasts longer than 5 minutes.       B.  The patient doesn't awaken shortly after the seizure  C.  The patient has new problems such as difficulty seeing, speaking or moving  D.  The patient was injured during the seizure  E.  The patient has a temperature over 102 F (39C)  F.  The patient vomited and now is having trouble breathing         
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
## 2017-04-22 ENCOUNTER — Telehealth: Payer: Self-pay | Admitting: Neurology

## 2017-04-22 NOTE — Telephone Encounter (Signed)
[facility]. [facility]

## 2017-04-24 NOTE — Telephone Encounter (Signed)
Patient called back regarding a letter that he had received [facility].  Patient would like to speak with you. Thanks

## 2017-04-24 NOTE — Telephone Encounter (Signed)
Spoke with pt.  He states that he had some fines to pay off and now that he has, he is needing [facility] [place].  Advised that I would fax form, but form requires pt's signature.  Pt states he will be in either tomorrow or Friday to sign.

## 2017-04-26 ENCOUNTER — Telehealth: Payer: Self-pay | Admitting: Neurology

## 2017-04-26 NOTE — Telephone Encounter (Signed)
DMV forms placed up front for pt to fill out and sign.

## 2017-04-26 NOTE — Telephone Encounter (Signed)
Patient called and needed to sign his DMV paperwork. Can it be brought to the front? Thanks

## 2017-11-02 ENCOUNTER — Other Ambulatory Visit: Payer: Self-pay | Admitting: Neurology

## 2017-11-02 DIAGNOSIS — G40009 Localization-related (focal) (partial) idiopathic epilepsy and epileptic syndromes with seizures of localized onset, not intractable, without status epilepticus: Secondary | ICD-10-CM

## 2017-11-04 ENCOUNTER — Other Ambulatory Visit: Payer: Self-pay

## 2017-11-04 ENCOUNTER — Ambulatory Visit (INDEPENDENT_AMBULATORY_CARE_PROVIDER_SITE_OTHER): Payer: BLUE CROSS/BLUE SHIELD | Admitting: Neurology

## 2017-11-04 ENCOUNTER — Encounter: Payer: Self-pay | Admitting: Neurology

## 2017-11-04 VITALS — BP 126/68 | HR 59 | Ht 72.0 in | Wt 174.0 lb

## 2017-11-04 DIAGNOSIS — G40009 Localization-related (focal) (partial) idiopathic epilepsy and epileptic syndromes with seizures of localized onset, not intractable, without status epilepticus: Secondary | ICD-10-CM | POA: Diagnosis not present

## 2017-11-04 MED ORDER — LEVETIRACETAM 1000 MG PO TABS: ORAL_TABLET | ORAL | 3 refills | Status: DC

## 2017-11-04 NOTE — Patient Instructions (Signed)
1. Continue Keppra 2000mg  twice a day 2. Schedule 1-hour EEG 3. Let's be more regimented with our sleep schedule and medication schedule. If seizures continue despite these measures, we will talk about adding on another medication 4. Follow-up in 1 year, call for any changes  Seizure Precautions: 1. If medication has been prescribed for you to prevent seizures, take it exactly as directed.  Do not stop taking the medicine without talking to your doctor first, even if you have not had a seizure in a long time.   2. Avoid activities in which a seizure would cause danger to yourself or to others.  Don't operate dangerous machinery, swim alone, or climb in high or dangerous places, such as on ladders, roofs, or girders.  Do not drive unless your doctor says you may.  3. If you have any warning that you may have a seizure, lay down in a safe place where you can't hurt yourself.    4.  No driving for 6 months from last seizure, [place] [facility].   Please refer to the following link on the Epilepsy Foundation of America's website for more information: http://www.epilepsyfoundation.org/answerplace/Social/driving/drivingu.cfm   5.  Maintain good sleep hygiene. Avoid alcohol.  6.  Contact your doctor if you have any problems that may be related to the medicine you are taking.  7.  Call 911 and bring the patient back to the ED if:        A.  The seizure lasts longer than 5 minutes.       B.  The patient doesn't awaken shortly after the seizure  C.  The patient has new problems such as difficulty seeing, speaking or moving  D.  The patient was injured during the seizure  E.  The patient has a temperature over 102 F (39C)  F.  The patient vomited and now is having trouble breathing
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
## 2017-11-20 ENCOUNTER — Other Ambulatory Visit: Payer: BLUE CROSS/BLUE SHIELD

## 2018-11-03 ENCOUNTER — Other Ambulatory Visit: Payer: Self-pay

## 2018-11-03 ENCOUNTER — Ambulatory Visit (INDEPENDENT_AMBULATORY_CARE_PROVIDER_SITE_OTHER): Payer: BC Managed Care – PPO | Admitting: Neurology

## 2018-11-03 ENCOUNTER — Encounter: Payer: Self-pay | Admitting: Neurology

## 2018-11-03 DIAGNOSIS — G40009 Localization-related (focal) (partial) idiopathic epilepsy and epileptic syndromes with seizures of localized onset, not intractable, without status epilepticus: Secondary | ICD-10-CM

## 2018-11-03 MED ORDER — LEVETIRACETAM 1000 MG PO TABS: ORAL_TABLET | ORAL | 3 refills | Status: DC

## 2018-11-03 NOTE — Patient Instructions (Signed)
Great seeing you! Continue Keppra 2000mg  twice a day. Follow-up in 1 year, call for any changes.  Seizure Precautions: 1. If medication has been prescribed for you to prevent seizures, take it exactly as directed.  Do not stop taking the medicine without talking to your doctor first, even if you have not had a seizure in a long time.   2. Avoid activities in which a seizure would cause danger to yourself or to others.  Don't operate dangerous machinery, swim alone, or climb in high or dangerous places, such as on ladders, roofs, or girders.  Do not drive unless your doctor says you may.  3. If you have any warning that you may have a seizure, lay down in a safe place where you can't hurt yourself.    4.  No driving for 6 months from last seizure, as per [facility].   Please refer to the following link on the [place] website for more information: http://www.epilepsyfoundation.org/answerplace/Social/driving/drivingu.cfm   5.  Maintain good sleep hygiene. Avoid alcohol.  6.  Contact your doctor if you have any problems that may be related to the medicine you are taking.  7.  Call 911 and bring the patient back to the ED if:        A.  The seizure lasts longer than 5 minutes.       B.  The patient doesn't awaken shortly after the seizure  C.  The patient has new problems such as difficulty seeing, speaking or moving  D.  The patient was injured during the seizure  E.  The patient has a temperature over 102 F (39C)  F.  The patient vomited and now is having trouble breathing
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
## 2018-12-10 ENCOUNTER — Telehealth: Payer: Self-pay | Admitting: Neurology

## 2018-12-10 NOTE — Telephone Encounter (Signed)
Patient called to request his prescription for Keppra 1000 MG be sent [facility] [place] on [address]. He said it was sent to a mail order pharmacy instead.

## 2018-12-11 ENCOUNTER — Other Ambulatory Visit: Payer: Self-pay

## 2018-12-11 DIAGNOSIS — G40009 Localization-related (focal) (partial) idiopathic epilepsy and epileptic syndromes with seizures of localized onset, not intractable, without status epilepticus: Secondary | ICD-10-CM

## 2018-12-11 MED ORDER — LEVETIRACETAM 1000 MG PO TABS: ORAL_TABLET | ORAL | 3 refills | Status: DC

## 2018-12-11 NOTE — Telephone Encounter (Signed)
[facility]ppra sent to [facility] [address]

## 2019-11-03 ENCOUNTER — Ambulatory Visit (INDEPENDENT_AMBULATORY_CARE_PROVIDER_SITE_OTHER): Payer: BC Managed Care – PPO | Admitting: Neurology

## 2019-11-03 ENCOUNTER — Encounter: Payer: Self-pay | Admitting: Neurology

## 2019-11-03 ENCOUNTER — Other Ambulatory Visit: Payer: Self-pay

## 2019-11-03 VITALS — BP 106/70 | HR 70 | Ht 71.0 in | Wt 172.2 lb

## 2019-11-03 DIAGNOSIS — G40009 Localization-related (focal) (partial) idiopathic epilepsy and epileptic syndromes with seizures of localized onset, not intractable, without status epilepticus: Secondary | ICD-10-CM

## 2019-11-03 MED ORDER — LEVETIRACETAM 1000 MG PO TABS: ORAL_TABLET | ORAL | 3 refills | Status: DC
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
## 2019-11-03 NOTE — Patient Instructions (Signed)
Always good to see you! Continue Keppra 2000mg  twice a day. Follow-up in a year, call for any changes. Congratulations in advance!!   Seizure precautions: 1. If medication has been prescribed for you to prevent seizures, take it exactly as directed.  Do not stop taking the medicine without talking to your doctor first, even if you have not had a seizure in a long time.   2. Avoid activities in which a seizure would cause danger to yourself or to others.  Don't operate dangerous machinery, swim alone, or climb in high or dangerous places, such as on ladders, roofs, or girders.  Do not drive unless your doctor says you may.  3. If you have any warning that you may have a seizure, lay down in a safe place where you can't hurt yourself.    4.  No driving for 6 months from last seizure, as per [facility].   Please refer to the following link on the [employer]ile[employer] of [employer]'s website for more information: http://www.epilepsyfoundation.org/answerplace/Social/driving/drivingu.cfm   5.  Maintain good sleep hygiene. Avoid alcohol.  6.  Contact your doctor if you have any problems that may be related to the medicine you are taking.  7.  Call 911 and bring the patient back to the ED if:        A.  The seizure lasts longer than 5 minutes.       B.  The patient doesn't awaken shortly after the seizure  C.  The patient has new problems such as difficulty seeing, speaking or moving  D.  The patient was injured during the seizure  E.  The patient has a temperature over 102 F (39C)  F.  The patient vomited and now is having trouble breathing

## 2020-01-07 ENCOUNTER — Other Ambulatory Visit: Payer: Self-pay | Admitting: Neurology

## 2020-01-07 DIAGNOSIS — G40009 Localization-related (focal) (partial) idiopathic epilepsy and epileptic syndromes with seizures of localized onset, not intractable, without status epilepticus: Secondary | ICD-10-CM

## 2020-10-18 ENCOUNTER — Other Ambulatory Visit: Payer: Self-pay | Admitting: Neurology

## 2020-10-18 DIAGNOSIS — G40009 Localization-related (focal) (partial) idiopathic epilepsy and epileptic syndromes with seizures of localized onset, not intractable, without status epilepticus: Secondary | ICD-10-CM

## 2020-10-18 NOTE — Telephone Encounter (Signed)
Given enough to follow up visit only with Dr.Karen Karel Jarvis, no further refills until seen.

## 2020-11-02 ENCOUNTER — Encounter: Payer: Self-pay | Admitting: Neurology

## 2020-11-02 ENCOUNTER — Other Ambulatory Visit: Payer: Self-pay

## 2020-11-02 ENCOUNTER — Telehealth (INDEPENDENT_AMBULATORY_CARE_PROVIDER_SITE_OTHER): Payer: BC Managed Care – PPO | Admitting: Neurology

## 2020-11-02 DIAGNOSIS — G40009 Localization-related (focal) (partial) idiopathic epilepsy and epileptic syndromes with seizures of localized onset, not intractable, without status epilepticus: Secondary | ICD-10-CM | POA: Diagnosis not present

## 2020-11-02 MED ORDER — CLONAZEPAM 0.5 MG PO TABS: ORAL_TABLET | ORAL | 5 refills | Status: DC

## 2020-11-02 MED ORDER — LEVETIRACETAM 1000 MG PO TABS: 2000.0000 mg | ORAL_TABLET | Freq: Two times a day (BID) | ORAL | 3 refills | Status: DC
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
## 2021-02-12 HISTORY — PX: VASECTOMY: SHX75

## 2021-04-20 DIAGNOSIS — Z029 Encounter for administrative examinations, unspecified: Secondary | ICD-10-CM

## 2021-05-02 ENCOUNTER — Telehealth: Payer: Self-pay

## 2021-05-02 NOTE — Telephone Encounter (Signed)
Pt called an informed that DMV paperwork is ready for pick up and that it was faxed to the [facility] for him ,  ?

## 2021-10-03 ENCOUNTER — Ambulatory Visit: Payer: BC Managed Care – PPO | Admitting: Neurology

## 2021-10-03 ENCOUNTER — Encounter: Payer: Self-pay | Admitting: Neurology

## 2021-10-03 VITALS — BP 118/74 | HR 81 | Resp 18 | Ht 71.0 in | Wt 169.0 lb

## 2021-10-03 DIAGNOSIS — G40009 Localization-related (focal) (partial) idiopathic epilepsy and epileptic syndromes with seizures of localized onset, not intractable, without status epilepticus: Secondary | ICD-10-CM

## 2021-10-03 MED ORDER — LEVETIRACETAM 1000 MG PO TABS: 2000.0000 mg | ORAL_TABLET | Freq: Two times a day (BID) | ORAL | 3 refills | Status: DC

## 2021-10-03 NOTE — Patient Instructions (Signed)
Good to see you.  Schedule EEG  2. Continue Keppra 1000mg : take 2 tablets twice a day. Let me know if you need refills for the clonazepam.  3. Follow-up in 1 year, call for any changes.   Seizure Precautions: 1. If medication has been prescribed for you to prevent seizures, take it exactly as directed.  Do not stop taking the medicine without talking to your doctor first, even if you have not had a seizure in a long time.   2. Avoid activities in which a seizure would cause danger to yourself or to others.  Don't operate dangerous machinery, swim alone, or climb in high or dangerous places, such as on ladders, roofs, or girders.  Do not drive unless your doctor says you may.  3. If you have any warning that you may have a seizure, lay down in a safe place where you can't hurt yourself.    4.  No driving for 6 months from last seizure, as per [facility].   Please refer to the following link on the Epilepsy Foundation of America's website for more information: http://www.epilepsyfoundation.org/answerplace/Social/driving/drivingu.cfm   5.  Maintain good sleep hygiene. Avoid alcohol.  6.  Contact your doctor if you have any problems that may be related to the medicine you are taking.  7.  Call 911 and bring the patient back to the ED if:        A.  The seizure lasts longer than 5 minutes.       B.  The patient doesn't awaken shortly after the seizure  C.  The patient has new problems such as difficulty seeing, speaking or moving  D.  The patient was injured during the seizure  E.  The patient has a temperature over 102 F (39C)  F.  The patient vomited and now is having trouble breathing
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
## 2021-11-14 ENCOUNTER — Ambulatory Visit (HOSPITAL_COMMUNITY): Payer: BC Managed Care – PPO

## 2021-12-01 ENCOUNTER — Ambulatory Visit (HOSPITAL_COMMUNITY)
Admission: RE | Admit: 2021-12-01 | Discharge: 2021-12-01 | Disposition: A | Discharge status: Home / Self Care | Payer: BC Managed Care – PPO | Source: Ambulatory Visit | Attending: Neurology | Admitting: Neurology

## 2021-12-01 DIAGNOSIS — G40009 Localization-related (focal) (partial) idiopathic epilepsy and epileptic syndromes with seizures of localized onset, not intractable, without status epilepticus: Secondary | ICD-10-CM | POA: Diagnosis not present

## 2021-12-01 DIAGNOSIS — R569 Unspecified convulsions: Secondary | ICD-10-CM | POA: Diagnosis present

## 2021-12-01 NOTE — Progress Notes (Signed)
EEG complete - results pending 

## 2021-12-05 NOTE — Procedures (Signed)
ELECTROENCEPHALOGRAM REPORT  Date of Study: 12/01/2021  Patient's Name: Reginald Dyer MRN: [id number] Date of Birth: 12-03-1990  Referring Provider: Dr. Ellouise Newer  Clinical History: This is a 31 year old man with recurrent seizures, recent episode of staring. EEG for classification.  Medications: Levetiracetam  Technical Summary: A multichannel digital EEG recording measured by the international 10-20 system with electrodes applied with paste and impedances below 5000 ohms performed in our laboratory with EKG monitoring in an awake and asleep patient.  Hyperventilation and photic stimulation were performed.  The digital EEG was referentially recorded, reformatted, and digitally filtered in a variety of bipolar and referential montages for optimal display.    Description: The patient is awake and asleep during the recording.  During maximal wakefulness, there is a symmetric, medium voltage 10 Hz posterior dominant rhythm that attenuates with eye opening.  There is occasional focal 3-4 Hz theta delta slowing over the left frontotemporal region. There were 2 bursts of generalized 3-4 Hz high voltage theta-delta activity with embedded spikes lasting 2-3 seconds.  During drowsiness and sleep, there is an increase in theta slowing of the background.  Vertex waves and symmetric sleep spindles were seen. During hyperventilation, at around 1 minute into HV, rhythmic 2-3 Hz activity is seen on both temporal chains for 7 seconds, then continues on the left temporal region for another 20 seconds. There is no clinical change seen, he continues to hyperventilate during event. During photic stimulation, toward the end of IPS at 19 Hz and during IPS at 21 Hz, rhythmic 2-3 Hz activity is seen maximal over the right temporal region with spread to the bilateral frontal regions lasting for 22 seconds, again with no clinical correlation seen.     EKG lead was unremarkable.  Impression: This awake and asleep EEG  is abnormal due to the presence of: Occasional focal slowing over the left frontotemporal region Rare bursts of generalized slowing with embedded spikes Electrographic seizure arising from the right temporal region Electrographic seizure arising from bilateral temporal regions more predominantly on left temporal region  Clinical Correlation of the above findings indicates focal cerebral dysfunction over the left temporal region suggestive of underlying structural or physiologic abnormality. 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    Ellouise Newer, M.D.

## 2021-12-08 ENCOUNTER — Telehealth (INDEPENDENT_AMBULATORY_CARE_PROVIDER_SITE_OTHER): Payer: BC Managed Care – PPO | Admitting: Neurology

## 2021-12-08 ENCOUNTER — Encounter: Payer: Self-pay | Admitting: Neurology

## 2021-12-08 VITALS — Ht 71.0 in | Wt 170.0 lb

## 2021-12-08 DIAGNOSIS — G40009 Localization-related (focal) (partial) idiopathic epilepsy and epileptic syndromes with seizures of localized onset, not intractable, without status epilepticus: Secondary | ICD-10-CM | POA: Diagnosis not present

## 2021-12-08 MED ORDER — LAMOTRIGINE 25 MG PO TABS: 25.0000 mg | ORAL_TABLET | Freq: Two times a day (BID) | ORAL | 3 refills | Status: DC

## 2021-12-08 NOTE — Patient Instructions (Signed)
Good to see you doing well.  Start Lamotrigine 25mg  twice a day  2. Continue Levetiracetam 2000mg  twice a day  3. Schedule repeat EEG for December 2023  4. Follow-up after EEG, call for any changes   Seizure Precautions: 1. If medication has been prescribed for you to prevent seizures, take it exactly as directed.  Do not stop taking the medicine without talking to your doctor first, even if you have not had a seizure in a long time.   2. Avoid activities in which a seizure would cause danger to yourself or to others.  Don't operate dangerous machinery, swim alone, or climb in high or dangerous places, such as on ladders, roofs, or girders.  Do not drive unless your doctor says you may.  3. If you have any warning that you may have a seizure, lay down in a safe place where you can't hurt yourself.    4.  No driving for 6 months from last seizure, as per [facility].   Please refer to the following link on the [facility] website for more information: http://www.epilepsyfoundation.org/answerplace/Social/driving/drivingu.cfm   5.  Maintain good sleep hygiene. Avoid alcohol.  6.  Contact your doctor if you have any problems that may be related to the medicine you are taking.  7.  Call 911 and bring the patient back to the ED if:        A.  The seizure lasts longer than 5 minutes.       B.  The patient doesn't awaken shortly after the seizure  C.  The patient has new problems such as difficulty seeing, speaking or moving  D.  The patient was injured during the seizure  E.  The patient has a temperature over 102 F (39C)  F.  The patient vomited and now is having trouble breathing
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
## 2021-12-29 ENCOUNTER — Other Ambulatory Visit: Payer: BC Managed Care – PPO

## 2021-12-29 ENCOUNTER — Encounter: Payer: Self-pay | Admitting: Neurology

## 2021-12-29 DIAGNOSIS — Z029 Encounter for administrative examinations, unspecified: Secondary | ICD-10-CM

## 2022-01-25 ENCOUNTER — Ambulatory Visit: Payer: BC Managed Care – PPO | Admitting: Neurology

## 2022-06-29 ENCOUNTER — Ambulatory Visit: Payer: BC Managed Care – PPO | Admitting: Neurology

## 2022-10-05 ENCOUNTER — Ambulatory Visit: Payer: BC Managed Care – PPO | Admitting: Neurology

## 2022-10-05 ENCOUNTER — Encounter: Payer: Self-pay | Admitting: Neurology

## 2022-10-05 VITALS — BP 121/76 | HR 64 | Ht 71.0 in | Wt 176.6 lb

## 2022-10-05 DIAGNOSIS — G40009 Localization-related (focal) (partial) idiopathic epilepsy and epileptic syndromes with seizures of localized onset, not intractable, without status epilepticus: Secondary | ICD-10-CM | POA: Diagnosis not present

## 2022-10-05 MED ORDER — LAMOTRIGINE 25 MG PO TABS: 25.0000 mg | ORAL_TABLET | Freq: Two times a day (BID) | ORAL | 3 refills | Status: DC

## 2022-10-05 MED ORDER — LEVETIRACETAM 1000 MG PO TABS: 2000.0000 mg | ORAL_TABLET | Freq: Two times a day (BID) | ORAL | 3 refills | Status: DC

## 2022-10-05 NOTE — Patient Instructions (Signed)
Always good to see you.  Schedule 1-hour EEG  2. Continue Lamotrigine 25mg  twice a day and Levetiracetam 1000mg : Take 2 tablets twice a day  3. Follow-up in 6 months, call for any changes   Seizure Precautions: 1. If medication has been prescribed for you to prevent seizures, take it exactly as directed.  Do not stop taking the medicine without talking to your doctor first, even if you have not had a seizure in a long time.   2. Avoid activities in which a seizure would cause danger to yourself or to others.  Don't operate dangerous machinery, swim alone, or climb in high or dangerous places, such as on ladders, roofs, or girders.  Do not drive unless your doctor says you may.  3. If you have any warning that you may have a seizure, lay down in a safe place where you can't hurt yourself.    4.  No driving for 6 months from last seizure, as per [facility].   Please refer to the following link on the Epilepsy Foundation of America's website for more information: http://www.epilepsyfoundation.org/answerplace/Social/driving/drivingu.cfm   5.  Maintain good sleep hygiene. Avoid alcohol.  6.  Contact your doctor if you have any problems that may be related to the medicine you are taking.  7.  Call 911 and bring the patient back to the ED if:        A.  The seizure lasts longer than 5 minutes.       B.  The patient doesn't awaken shortly after the seizure  C.  The patient has new problems such as difficulty seeing, speaking or moving  D.  The patient was injured during the seizure  E.  The patient has a temperature over 102 F (39C)  F.  The patient vomited and now is having trouble breathing
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
## 2022-10-12 ENCOUNTER — Ambulatory Visit (INDEPENDENT_AMBULATORY_CARE_PROVIDER_SITE_OTHER): Payer: BC Managed Care – PPO | Admitting: Neurology

## 2022-10-12 DIAGNOSIS — G40009 Localization-related (focal) (partial) idiopathic epilepsy and epileptic syndromes with seizures of localized onset, not intractable, without status epilepticus: Secondary | ICD-10-CM | POA: Diagnosis not present

## 2022-10-12 NOTE — Progress Notes (Signed)
EEG complete - results pending 

## 2022-10-31 ENCOUNTER — Telehealth: Payer: Self-pay | Admitting: Neurology

## 2022-10-31 DIAGNOSIS — G40209 Localization-related (focal) (partial) symptomatic epilepsy and epileptic syndromes with complex partial seizures, not intractable, without status epilepticus: Secondary | ICD-10-CM

## 2022-10-31 DIAGNOSIS — G40009 Localization-related (focal) (partial) idiopathic epilepsy and epileptic syndromes with seizures of localized onset, not intractable, without status epilepticus: Secondary | ICD-10-CM

## 2022-10-31 MED ORDER — LAMOTRIGINE 25 MG PO TABS: ORAL_TABLET | ORAL | 3 refills | Status: DC

## 2022-10-31 NOTE — Telephone Encounter (Signed)
Called patient re: EEG. EEG is improved from last one, however during HV there was a period of rhythmic activity over the right temporal region lasting 12 seconds. There was also burst of generalized spike wave activity in sleep followed by rhythmic delta maximal over bilateral temporal regions for 9 seconds in sleep. No clinical changes. Recommend increasing Lamotrigine 25mg : take 2 tablets twice a day. Continue Keppra 2000mg  BID. Repeat EEG in 1 month. Patient expressed understanding.   Heather, pls order 1-hour EEG to be done in October 2024, thanks

## 2022-10-31 NOTE — Addendum Note (Signed)
Addended by: Dimas Chyle on: 10/31/2022 01:31 PM   Modules accepted: Orders

## 2022-10-31 NOTE — Procedures (Signed)
ELECTROENCEPHALOGRAM REPORT  Date of Study: 10/12/2022  Patient's Name: Reginald Dyer MRN: [id number] Date of Birth: 06/27/1990  Referring Provider: Dr. Patrcia Dolly  Clinical History: This is a 32 year old man with episode of staring in May/June 2023, EEG in 11/2021 captured 2 electrographic seizures, one arising from the right temporal region, the other from the bilateral temporal regions predominantly on the left temporal region. Medication adjustment made, repeat EEG to assess for continued seizure activity.  Medications: Lamotrigine, Levetiracetam  Technical Summary: A multichannel digital 1-hour EEG recording measured by the international 10-20 system with electrodes applied with paste and impedances below 5000 ohms performed in our laboratory with EKG monitoring in an awake and asleep patient.  Hyperventilation and photic stimulation were performed.  The digital EEG was referentially recorded, reformatted, and digitally filtered in a variety of bipolar and referential montages for optimal display.    Description: The patient is awake and asleep during the recording.  During maximal wakefulness, there is a symmetric, medium voltage 10 Hz posterior dominant rhythm that attenuates with eye opening.  During hyperventilation, there is a run of rhythmic 3 Hz delta activity over the right temporal region lasting 12 seconds with no clinical changes, patient continues to hyperventilate. Photic stimulation did not elicit any abnormalities. During drowsiness and sleep, there is an increase in theta slowing of the background. Vertex waves and symmetric sleep spindles were seen. During sleep, there are 2 runs of rhythmic 2-3 Hz delta activity over the left temporal region lasting 2 seconds and 4 seconds. There are occasional bursts of generalized high voltage 3-4 Hz spike and wave discharges with frontal predominance in sleep. There is one instance of a 3-second burst of generalized spike and wave activity  followed by rhythmic 2-3 Hz generalized delta activity maximal over the bilateral temporal regions lasting 9 seconds. No clinical correlate with EEG changes.  EKG lead was unremarkable.  Impression: This 1-hour awake and asleep EEG is abnormal due to the presence of: Rare bursts of focal slowing over the left temporal region in sleep Run of rhythmic delta activity over the right temporal region during hyperventilation, no clinical changes Occasional bursts of generalized discharges with frontal predominance in sleep    Clinical Correlation of the above findings indicates focal cerebral dysfunction over the bilateral temporal regionssuggestive of underlying structural or physiologic abnormality. 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   Patrcia Dolly, M.D.

## 2022-11-16 ENCOUNTER — Ambulatory Visit: Payer: BC Managed Care – PPO | Admitting: Neurology

## 2022-11-16 DIAGNOSIS — G40009 Localization-related (focal) (partial) idiopathic epilepsy and epileptic syndromes with seizures of localized onset, not intractable, without status epilepticus: Secondary | ICD-10-CM | POA: Diagnosis not present

## 2022-11-16 DIAGNOSIS — G40209 Localization-related (focal) (partial) symptomatic epilepsy and epileptic syndromes with complex partial seizures, not intractable, without status epilepticus: Secondary | ICD-10-CM

## 2022-12-05 NOTE — Procedures (Signed)
ELECTROENCEPHALOGRAM REPORT  Date of Study: 11/16/2022  Patient's Name: Reginald Dyer MRN: [id number] Date of Birth: 23-Sep-1990  Referring Provider: Dr. Patrcia Dolly  Clinical History: This is a 32 year old man with episode of staring in May/June 2023, EEG in 11/2021 and 09/2022 showed rhythmic activity over the bilateral temporal regions concerning for electrographic seizure. Medication adjustment made, repeat EEG to assess for continued seizure activity.   Medications: Lamotrigine, Levetiracetam  Technical Summary: A multichannel digital 1-hour EEG recording measured by the international 10-20 system with electrodes applied with paste and impedances below 5000 ohms performed in our laboratory with EKG monitoring in an awake and asleep patient.  Hyperventilation and photic stimulation were performed.  The digital EEG was referentially recorded, reformatted, and digitally filtered in a variety of bipolar and referential montages for optimal display.    Description: The patient is awake and asleep during the recording.  During maximal wakefulness, there is a symmetric, medium voltage 10 Hz posterior dominant rhythm that attenuates with eye opening.  The record is symmetric.  After 2 minutes of hyperventilation, rhythmic 3 Hz delta activity is seen over the right temporal region for 4 seconds then this spreads diffusely, maximal over the frontal regions with embedded spikes seen lasting 6 seconds. He briefly stops hyperventilating at the end of this activity. Hyperventilation was repeated and similar changes were seen after 2 minutes of HV, rhythmic 3 Hz activity is seen over the right temporal region for 4 seconds, this stops for 3 seconds then is seen for 2 seconds as patient continues to hyperventilate. EEG returns for normal for 30 seconds then generalized 3 Hz activity with frontal predominance is seen lasting 5 seconds and patient briefly stops hyperventilating. He recalls code word given. At 30  seconds post-HV, there is a run of generalized rhythmic 3 Hz activity with frontal predominance lasting 8 seconds with no clinical changes seen. Photic stimulation did not elicit any abnormalities.   During drowsiness and sleep, there is an increase in theta slowing of the background.  Vertex waves and symmetric sleep spindles were seen. During sleep, there is a generalized spike with frontal predominance seen followed by rhythmic generalized 3 Hz activity for 2 seconds then localized 3 Hz delta slowing over the left hemisphere maximal over the left temporal region lasting 9 seconds. No clinical changes seen.   EKG lead was unremarkable.  Impression: This 1-hour awake and asleep EEG is abnormal due to the presence of: Rare generalized discharges Runs of rhythmic delta activity over the right temporal region were replicated during hyperventilation x 2 followed by generalized delta slowing with apparent very brief behavioral arrest, able to recall code word Run of rhythmic delta activity maximal over the left temporal region     Clinical Correlation of the above findings indicates focal cerebral dysfunction over the bilateral temporal regions suggestive of underlying structural or physiologic abnormality. Bursts of generalized discharges and apparent very brief behavioral arrest during hyperventilation x 2 may be seen with a generalized epilepsy however the focal runs of slowing seen independently over the bilateral temporal regions also raises concern for secondary bisynchrony from a focal epilepsy.   Patrcia Dolly, M.D.

## 2023-02-28 ENCOUNTER — Encounter: Payer: Self-pay | Admitting: Neurology

## 2023-04-10 ENCOUNTER — Encounter: Payer: Self-pay | Admitting: Neurology

## 2023-04-10 ENCOUNTER — Ambulatory Visit: Payer: BC Managed Care – PPO | Admitting: Neurology

## 2023-04-10 DIAGNOSIS — G40009 Localization-related (focal) (partial) idiopathic epilepsy and epileptic syndromes with seizures of localized onset, not intractable, without status epilepticus: Secondary | ICD-10-CM | POA: Diagnosis not present

## 2023-04-10 MED ORDER — LEVETIRACETAM 1000 MG PO TABS: 2000.0000 mg | ORAL_TABLET | Freq: Two times a day (BID) | ORAL | 3 refills | Status: DC

## 2023-04-10 MED ORDER — LAMOTRIGINE 25 MG PO TABS: ORAL_TABLET | ORAL | 3 refills | Status: DC

## 2023-04-10 MED ORDER — CLONAZEPAM 0.5 MG PO TABS: ORAL_TABLET | ORAL | 5 refills | Status: DC

## 2023-04-10 NOTE — Patient Instructions (Signed)
 Always a pleasure to see you! Continue Levetiracetam (Keppra) 1000mg : take 2 tablets twice a day and Lamotrigine 25mg : take 2 tablets twice a day. Follow-up in 6 months, call for any changes.    Seizure Precautions: 1. If medication has been prescribed for you to prevent seizures, take it exactly as directed.  Do not stop taking the medicine without talking to your doctor first, even if you have not had a seizure in a long time.   2. Avoid activities in which a seizure would cause danger to yourself or to others.  Don't operate dangerous machinery, swim alone, or climb in high or dangerous places, such as on ladders, roofs, or girders.  Do not drive unless your doctor says you may.  3. If you have any warning that you may have a seizure, lay down in a safe place where you can't hurt yourself.    4.  No driving for 6 months from last seizure, as per [facility].   Please refer to the following link on the Epilepsy Foundation of America's website for more information: http://www.epilepsyfoundation.org/answerplace/Social/driving/drivingu.cfm   5.  Maintain good sleep hygiene. Avoid alcohol.  6.  Contact your doctor if you have any problems that may be related to the medicine you are taking.  7.  Call 911 and bring the patient back to the ED if:        A.  The seizure lasts longer than 5 minutes.       B.  The patient doesn't awaken shortly after the seizure  C.  The patient has new problems such as difficulty seeing, speaking or moving  D.  The patient was injured during the seizure  E.  The patient has a temperature over 102 F (39C)  F.  The patient vomited and now is having trouble breathing
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
## 2023-04-26 ENCOUNTER — Ambulatory Visit: Payer: BC Managed Care – PPO | Admitting: Neurology

## 2023-11-08 ENCOUNTER — Ambulatory Visit: Payer: BC Managed Care – PPO | Admitting: Neurology

## 2023-11-08 ENCOUNTER — Encounter: Payer: Self-pay | Admitting: Neurology

## 2023-11-08 DIAGNOSIS — G40009 Localization-related (focal) (partial) idiopathic epilepsy and epileptic syndromes with seizures of localized onset, not intractable, without status epilepticus: Secondary | ICD-10-CM | POA: Diagnosis not present

## 2023-11-08 MED ORDER — CLONAZEPAM 0.5 MG PO TABS
ORAL_TABLET | ORAL | 5 refills | Status: AC
Start: 2023-11-08 — End: ?

## 2023-11-08 MED ORDER — LEVETIRACETAM 1000 MG PO TABS
2000.0000 mg | ORAL_TABLET | Freq: Two times a day (BID) | ORAL | 4 refills | Status: AC
Start: 2023-11-08 — End: ?

## 2023-11-08 MED ORDER — LAMOTRIGINE 25 MG PO TABS: ORAL_TABLET | ORAL | 4 refills | Status: AC
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
## 2023-11-08 NOTE — Patient Instructions (Signed)
 It's always a pleasure to see you! Continue all your medications. Follow-up in 1 year, call for any changes.   Seizure Precautions: 1. If medication has been prescribed for you to prevent seizures, take it exactly as directed.  Do not stop taking the medicine without talking to your doctor first, even if you have not had a seizure in a long time.   2. Avoid activities in which a seizure would cause danger to yourself or to others.  Don't operate dangerous machinery, swim alone, or climb in high or dangerous places, such as on ladders, roofs, or girders.  Do not drive unless your doctor says you may.  3. If you have any warning that you may have a seizure, lay down in a safe place where you can't hurt yourself.    4.  No driving for 6 months from last seizure, as per [place]  state law.   Please refer to the following link on the Epilepsy Foundation of America's website for more information: http://www.epilepsyfoundation.org/answerplace/Social/driving/drivingu.cfm   5.  Maintain good sleep hygiene. Avoid alcohol.  6.  Contact your doctor if you have any problems that may be related to the medicine you are taking.  7.  Call 911 and bring the patient back to the ED if:        A.  The seizure lasts longer than 5 minutes.       B.  The patient doesn't awaken shortly after the seizure  C.  The patient has new problems such as difficulty seeing, speaking or moving  D.  The patient was injured during the seizure  E.  The patient has a temperature over 102 F (39C)  F.  The patient vomited and now is having trouble breathing

## 2023-12-31 ENCOUNTER — Encounter: Payer: Self-pay | Admitting: Neurology

## 2024-11-13 ENCOUNTER — Ambulatory Visit: Admitting: Neurology
